# Patient Record
Sex: Male | Born: 1995
Health system: Southern US, Community
[De-identification: ages and names within clinical notes are randomized; demographics above are authoritative.]

---
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
## 2020-04-15 ENCOUNTER — Ambulatory Visit: Payer: Self-pay | Attending: Internal Medicine

## 2020-04-15 DIAGNOSIS — Z23 Encounter for immunization: Secondary | ICD-10-CM

## 2020-04-15 NOTE — Progress Notes (Signed)
° °  Covid-[facility] Vaccination [facility]  Name:  Dietrich Samuelson Fann    MRN: [id number] DOB: February 21, 1996  04/15/2020  Mr. Narducci was observed post Covid-19 immunization for 15 minutes without incident. He was provided with Vaccine Information Sheet and instruction to access the V-Safe system.   Mr. Utz was instructed to call 911 with any severe reactions post vaccine:  Difficulty breathing   Swelling of face and throat   A fast heartbeat   A bad rash all over body   Dizziness and weakness   Immunizations Administered    Name Date Dose VIS Date Route   Pfizer COVID-19 Vaccine 04/15/2020 12:00 PM 0.3 mL 01/16/2019 Intramuscular   Manufacturer: [employer], [employer]   Lot: [id number]   NDC: [address]-6

## 2020-05-06 ENCOUNTER — Ambulatory Visit: Payer: Self-pay | Attending: Internal Medicine

## 2020-05-06 DIAGNOSIS — Z23 Encounter for immunization: Secondary | ICD-10-CM

## 2020-05-06 NOTE — Progress Notes (Signed)
   Covid-19 Vaccination Clinic  Name:  Scott Bridges    MRN: [id number] DOB: 1996/07/05  05/06/2020  [name] was observed post Covid-19 immunization for 15 minutes without incident. He was provided with Vaccine Information Sheet and instruction to access the V-Safe system.   [name] was instructed to call 911 with any severe reactions post vaccine: Marland Kitchen Difficulty breathing  . Swelling of face and throat  . A fast heartbeat  . A bad rash all over body  . Dizziness and weakness   Immunizations Administered    Name Date Dose VIS Date Route   Pfizer COVID-19 Vaccine 05/06/2020 11:55 AM 0.3 mL 01/16/2019 Intramuscular   Manufacturer: [employer], [employer]   Lot: [id number]   NDC: [address]-1

## 2020-06-10 ENCOUNTER — Telehealth: Payer: Self-pay | Admitting: Licensed Clinical Social Worker

## 2020-06-10 NOTE — Telephone Encounter (Signed)
LCSW attempted to return patient's call regarding a need for services for depression. VM is full.
